# Patient Record
Sex: Female | Born: 1980 | Race: White | Hispanic: No | Marital: Single | Smoking: Never smoker
Health system: Southern US, Community
[De-identification: ages and names within clinical notes are randomized; demographics above are authoritative.]

## PROBLEM LIST (undated history)

## (undated) DIAGNOSIS — F319 Bipolar disorder, unspecified: Secondary | ICD-10-CM

## (undated) DIAGNOSIS — F329 Major depressive disorder, single episode, unspecified: Secondary | ICD-10-CM

## (undated) DIAGNOSIS — F32A Depression, unspecified: Secondary | ICD-10-CM

## (undated) HISTORY — DX: Bipolar disorder, unspecified: F31.9

## (undated) HISTORY — PX: OTHER SURGICAL HISTORY: SHX169

## (undated) HISTORY — DX: Major depressive disorder, single episode, unspecified: F32.9

## (undated) HISTORY — DX: Depression, unspecified: F32.A

---

## 2015-12-08 ENCOUNTER — Ambulatory Visit: Payer: Self-pay | Admitting: Women's Health

## 2015-12-20 ENCOUNTER — Ambulatory Visit (INDEPENDENT_AMBULATORY_CARE_PROVIDER_SITE_OTHER): Payer: 59 | Admitting: Women's Health

## 2015-12-20 ENCOUNTER — Encounter: Payer: Self-pay | Admitting: Women's Health

## 2015-12-20 VITALS — BP 122/80 | Ht 64.0 in | Wt 185.0 lb

## 2015-12-20 DIAGNOSIS — Z833 Family history of diabetes mellitus: Secondary | ICD-10-CM | POA: Diagnosis not present

## 2015-12-20 DIAGNOSIS — Z113 Encounter for screening for infections with a predominantly sexual mode of transmission: Secondary | ICD-10-CM

## 2015-12-20 DIAGNOSIS — Z01419 Encounter for gynecological examination (general) (routine) without abnormal findings: Secondary | ICD-10-CM

## 2015-12-20 LAB — CBC WITH DIFFERENTIAL/PLATELET
BASOS PCT: 0 %
Basophils Absolute: 0 cells/uL (ref 0–200)
EOS PCT: 2 %
Eosinophils Absolute: 124 cells/uL (ref 15–500)
HEMATOCRIT: 40.2 % (ref 35.0–45.0)
HEMOGLOBIN: 13.6 g/dL (ref 11.7–15.5)
LYMPHS ABS: 2480 {cells}/uL (ref 850–3900)
Lymphocytes Relative: 40 %
MCH: 31.1 pg (ref 27.0–33.0)
MCHC: 33.8 g/dL (ref 32.0–36.0)
MCV: 91.8 fL (ref 80.0–100.0)
MONO ABS: 496 {cells}/uL (ref 200–950)
MPV: 10.7 fL (ref 7.5–12.5)
Monocytes Relative: 8 %
NEUTROS ABS: 3100 {cells}/uL (ref 1500–7800)
NEUTROS PCT: 50 %
Platelets: 261 10*3/uL (ref 140–400)
RBC: 4.38 MIL/uL (ref 3.80–5.10)
RDW: 12.4 % (ref 11.0–15.0)
WBC: 6.2 10*3/uL (ref 3.8–10.8)

## 2015-12-20 LAB — HEPATITIS C ANTIBODY: HCV AB: NEGATIVE

## 2015-12-20 LAB — GLUCOSE, RANDOM: GLUCOSE: 92 mg/dL (ref 65–99)

## 2015-12-20 LAB — HEPATITIS B SURFACE ANTIGEN: HEP B S AG: NEGATIVE

## 2015-12-20 LAB — HIV ANTIBODY (ROUTINE TESTING W REFLEX): HIV: NONREACTIVE

## 2015-12-20 NOTE — Addendum Note (Signed)
Addended by: Kem ParkinsonBARNES, Nery Kalisz on: 12/20/2015 12:10 PM   Modules accepted: Orders

## 2015-12-20 NOTE — Patient Instructions (Signed)
Health Maintenance, Female Adopting a healthy lifestyle and getting preventive care can go a long way to promote health and wellness. Talk with your health care provider about what schedule of regular examinations is right for you. This is a good chance for you to check in with your provider about disease prevention and staying healthy. In between checkups, there are plenty of things you can do on your own. Experts have done a lot of research about which lifestyle changes and preventive measures are most likely to keep you healthy. Ask your health care provider for more information. WEIGHT AND DIET  Eat a healthy diet  Be sure to include plenty of vegetables, fruits, low-fat dairy products, and lean protein.  Do not eat a lot of foods high in solid fats, added sugars, or salt.  Get regular exercise. This is one of the most important things you can do for your health.  Most adults should exercise for at least 150 minutes each week. The exercise should increase your heart rate and make you sweat (moderate-intensity exercise).  Most adults should also do strengthening exercises at least twice a week. This is in addition to the moderate-intensity exercise.  Maintain a healthy weight  Body mass index (BMI) is a measurement that can be used to identify possible weight problems. It estimates body fat based on height and weight. Your health care provider can help determine your BMI and help you achieve or maintain a healthy weight.  For females 20 years of age and older:   A BMI below 18.5 is considered underweight.  A BMI of 18.5 to 24.9 is normal.  A BMI of 25 to 29.9 is considered overweight.  A BMI of 30 and above is considered obese.  Watch levels of cholesterol and blood lipids  You should start having your blood tested for lipids and cholesterol at 34 years of age, then have this test every 5 years.  You may need to have your cholesterol levels checked more often if:  Your lipid  or cholesterol levels are high.  You are older than 34 years of age.  You are at high risk for heart disease.  CANCER SCREENING   Lung Cancer  Lung cancer screening is recommended for adults 55-80 years old who are at high risk for lung cancer because of a history of smoking.  A yearly low-dose CT scan of the lungs is recommended for people who:  Currently smoke.  Have quit within the past 15 years.  Have at least a 30-pack-year history of smoking. A pack year is smoking an average of one pack of cigarettes a day for 1 year.  Yearly screening should continue until it has been 15 years since you quit.  Yearly screening should stop if you develop a health problem that would prevent you from having lung cancer treatment.  Breast Cancer  Practice breast self-awareness. This means understanding how your breasts normally appear and feel.  It also means doing regular breast self-exams. Let your health care provider know about any changes, no matter how small.  If you are in your 20s or 30s, you should have a clinical breast exam (CBE) by a health care provider every 1-3 years as part of a regular health exam.  If you are 40 or older, have a CBE every year. Also consider having a breast X-ray (mammogram) every year.  If you have a family history of breast cancer, talk to your health care provider about genetic screening.  If you   are at high risk for breast cancer, talk to your health care provider about having an MRI and a mammogram every year.  Breast cancer gene (BRCA) assessment is recommended for women who have family members with BRCA-related cancers. BRCA-related cancers include:  Breast.  Ovarian.  Tubal.  Peritoneal cancers.  Results of the assessment will determine the need for genetic counseling and BRCA1 and BRCA2 testing. Cervical Cancer Your health care provider may recommend that you be screened regularly for cancer of the pelvic organs (ovaries, uterus, and  vagina). This screening involves a pelvic examination, including checking for microscopic changes to the surface of your cervix (Pap test). You may be encouraged to have this screening done every 3 years, beginning at age 21.  For women ages 30-65, health care providers may recommend pelvic exams and Pap testing every 3 years, or they may recommend the Pap and pelvic exam, combined with testing for human papilloma virus (HPV), every 5 years. Some types of HPV increase your risk of cervical cancer. Testing for HPV may also be done on women of any age with unclear Pap test results.  Other health care providers may not recommend any screening for nonpregnant women who are considered low risk for pelvic cancer and who do not have symptoms. Ask your health care provider if a screening pelvic exam is right for you.  If you have had past treatment for cervical cancer or a condition that could lead to cancer, you need Pap tests and screening for cancer for at least 20 years after your treatment. If Pap tests have been discontinued, your risk factors (such as having a new sexual partner) need to be reassessed to determine if screening should resume. Some women have medical problems that increase the chance of getting cervical cancer. In these cases, your health care provider may recommend more frequent screening and Pap tests. Colorectal Cancer  This type of cancer can be detected and often prevented.  Routine colorectal cancer screening usually begins at 34 years of age and continues through 34 years of age.  Your health care provider may recommend screening at an earlier age if you have risk factors for colon cancer.  Your health care provider may also recommend using home test kits to check for hidden blood in the stool.  A small camera at the end of a tube can be used to examine your colon directly (sigmoidoscopy or colonoscopy). This is done to check for the earliest forms of colorectal  cancer.  Routine screening usually begins at age 50.  Direct examination of the colon should be repeated every 5-10 years through 34 years of age. However, you may need to be screened more often if early forms of precancerous polyps or small growths are found. Skin Cancer  Check your skin from head to toe regularly.  Tell your health care provider about any new moles or changes in moles, especially if there is a change in a mole's shape or color.  Also tell your health care provider if you have a mole that is larger than the size of a pencil eraser.  Always use sunscreen. Apply sunscreen liberally and repeatedly throughout the day.  Protect yourself by wearing long sleeves, pants, a wide-brimmed hat, and sunglasses whenever you are outside. HEART DISEASE, DIABETES, AND HIGH BLOOD PRESSURE   High blood pressure causes heart disease and increases the risk of stroke. High blood pressure is more likely to develop in:  People who have blood pressure in the high end   of the normal range (130-139/85-89 mm Hg).  People who are overweight or obese.  People who are African American.  If you are 38-23 years of age, have your blood pressure checked every 3-5 years. If you are 61 years of age or older, have your blood pressure checked every year. You should have your blood pressure measured twice--once when you are at a hospital or clinic, and once when you are not at a hospital or clinic. Record the average of the two measurements. To check your blood pressure when you are not at a hospital or clinic, you can use:  An automated blood pressure machine at a pharmacy.  A home blood pressure monitor.  If you are between 45 years and 39 years old, ask your health care provider if you should take aspirin to prevent strokes.  Have regular diabetes screenings. This involves taking a blood sample to check your fasting blood sugar level.  If you are at a normal weight and have a low risk for diabetes,  have this test once every three years after 34 years of age.  If you are overweight and have a high risk for diabetes, consider being tested at a younger age or more often. PREVENTING INFECTION  Hepatitis B  If you have a higher risk for hepatitis B, you should be screened for this virus. You are considered at high risk for hepatitis B if:  You were born in a country where hepatitis B is common. Ask your health care provider which countries are considered high risk.  Your parents were born in a high-risk country, and you have not been immunized against hepatitis B (hepatitis B vaccine).  You have HIV or AIDS.  You use needles to inject street drugs.  You live with someone who has hepatitis B.  You have had sex with someone who has hepatitis B.  You get hemodialysis treatment.  You take certain medicines for conditions, including cancer, organ transplantation, and autoimmune conditions. Hepatitis C  Blood testing is recommended for:  Everyone born from [birth year] through 1964.  Anyone with known risk factors for hepatitis C. Sexually transmitted infections (STIs)  You should be screened for sexually transmitted infections (STIs) including gonorrhea and chlamydia if:  You are sexually active and are younger than 34 years of age.  You are older than 34 years of age and your health care provider tells you that you are at risk for this type of infection.  Your sexual activity has changed since you were last screened and you are at an increased risk for chlamydia or gonorrhea. Ask your health care provider if you are at risk.  If you do not have HIV, but are at risk, it may be recommended that you take a prescription medicine daily to prevent HIV infection. This is called pre-exposure prophylaxis (PrEP). You are considered at risk if:  You are sexually active and do not regularly use condoms or know the HIV status of your partner(s).  You take drugs by injection.  You are sexually  active with a partner who has HIV. Talk with your health care provider about whether you are at high risk of being infected with HIV. If you choose to begin PrEP, you should first be tested for HIV. You should then be tested every 3 months for as long as you are taking PrEP.  PREGNANCY   If you are premenopausal and you may become pregnant, ask your health care provider about preconception counseling.  If you may  become pregnant, take 400 to 800 micrograms (mcg) of folic acid every day.  If you want to prevent pregnancy, talk to your health care provider about birth control (contraception). OSTEOPOROSIS AND MENOPAUSE   Osteoporosis is a disease in which the bones lose minerals and strength with aging. This can result in serious bone fractures. Your risk for osteoporosis can be identified using a bone density scan.  If you are 65 years of age or older, or if you are at risk for osteoporosis and fractures, ask your health care provider if you should be screened.  Ask your health care provider whether you should take a calcium or vitamin D supplement to lower your risk for osteoporosis.  Menopause may have certain physical symptoms and risks.  Hormone replacement therapy may reduce some of these symptoms and risks. Talk to your health care provider about whether hormone replacement therapy is right for you.  HOME CARE INSTRUCTIONS   Schedule regular health, dental, and eye exams.  Stay current with your immunizations.   Do not use any tobacco products including cigarettes, chewing tobacco, or electronic cigarettes.  If you are pregnant, do not drink alcohol.  If you are breastfeeding, limit how much and how often you drink alcohol.  Limit alcohol intake to no more than 1 drink per day for nonpregnant women. One drink equals 12 ounces of beer, 5 ounces of wine, or 1 ounces of hard liquor.  Do not use street drugs.  Do not share needles.  Ask your health care provider for help if  you need support or information about quitting drugs.  Tell your health care provider if you often feel depressed.  Tell your health care provider if you have ever been abused or do not feel safe at home.   This information is not intended to replace advice given to you by your health care provider. Make sure you discuss any questions you have with your health care provider.   Document Released: 03/12/2011 Document Revised: 09/17/2014 Document Reviewed: 07/29/2013 Elsevier Interactive Patient Education 2016 Else[employer] Inc. Levonorgestrel intrauterine device (IUD) What is this medicine? LEVONORGESTREL IUD (LEE voe nor jes trel) is a contraceptive (birth control) device. The device is placed inside the uterus by a healthcare professional. It is used to prevent pregnancy and can also be used to treat heavy bleeding that occurs during your period. Depending on the device, it can be used for 3 to 5 years. This medicine may be used for other purposes; ask your health care provider or pharmacist if you have questions. What should I tell my health care provider before I take this medicine? They need to know if you have any of these conditions: -abnormal Pap smear -cancer of the breast, uterus, or cervix -diabetes -endometritis -genital or pelvic infection now or in the past -have more than one sexual partner or your partner has more than one partner -heart disease -history of an ectopic or tubal pregnancy -immune system problems -IUD in place -liver disease or tumor -problems with blood clots or take blood-thinners -use intravenous drugs -uterus of unusual shape -vaginal bleeding that has not been explained -an unusual or allergic reaction to levonorgestrel, other hormones, silicone, or polyethylene, medicines, foods, dyes, or preservatives -pregnant or trying to get pregnant -breast-feeding How should I use this medicine? This device is placed inside the uterus by a health care  professional. Talk to your pediatrician regarding the use of this medicine in children. Special care may be needed. Overdosage: If you think   you have taken too much of this medicine contact a poison control center or emergency room at once. NOTE: This medicine is only for you. Do not share this medicine with others. What if I miss a dose? This does not apply. What may interact with this medicine? Do not take this medicine with any of the following medications: -amprenavir -bosentan -fosamprenavir This medicine may also interact with the following medications: -aprepitant -barbiturate medicines for inducing sleep or treating seizures -bexarotene -griseofulvin -medicines to treat seizures like carbamazepine, ethotoin, felbamate, oxcarbazepine, phenytoin, topiramate -modafinil -pioglitazone -rifabutin -rifampin -rifapentine -some medicines to treat HIV infection like atazanavir, indinavir, lopinavir, nelfinavir, tipranavir, ritonavir -St. John's wort -warfarin This list may not describe all possible interactions. Give your health care provider a list of all the medicines, herbs, non-prescription drugs, or dietary supplements you use. Also tell them if you smoke, drink alcohol, or use illegal drugs. Some items may interact with your medicine. What should I watch for while using this medicine? Visit your doctor or health care professional for regular check ups. See your doctor if you or your partner has sexual contact with others, becomes HIV positive, or gets a sexual transmitted disease. This product does not protect you against HIV infection (AIDS) or other sexually transmitted diseases. You can check the placement of the IUD yourself by reaching up to the top of your vagina with clean fingers to feel the threads. Do not pull on the threads. It is a good habit to check placement after each menstrual period. Call your doctor right away if you feel more of the IUD than just the threads or if  you cannot feel the threads at all. The IUD may come out by itself. You may become pregnant if the device comes out. If you notice that the IUD has come out use a backup birth control method like condoms and call your health care provider. Using tampons will not change the position of the IUD and are okay to use during your period. What side effects may I notice from receiving this medicine? Side effects that you should report to your doctor or health care professional as soon as possible: -allergic reactions like skin rash, itching or hives, swelling of the face, lips, or tongue -fever, flu-like symptoms -genital sores -high blood pressure -no menstrual period for 6 weeks during use -pain, swelling, warmth in the leg -pelvic pain or tenderness -severe or sudden headache -signs of pregnancy -stomach cramping -sudden shortness of breath -trouble with balance, talking, or walking -unusual vaginal bleeding, discharge -yellowing of the eyes or skin Side effects that usually do not require medical attention (report to your doctor or health care professional if they continue or are bothersome): -acne -breast pain -change in sex drive or performance -changes in weight -cramping, dizziness, or faintness while the device is being inserted -headache -irregular menstrual bleeding within first 3 to 6 months of use -nausea This list may not describe all possible side effects. Call your doctor for medical advice about side effects. You may report side effects to FDA at 1-800-FDA-1088. Where should I keep my medicine? This does not apply. NOTE: This sheet is a summary. It may not cover all possible information. If you have questions about this medicine, talk to your doctor, pharmacist, or health care provider.    2016, Elsevier/Gold Standard. (2011-09-27 13:54:04)

## 2015-12-20 NOTE — Progress Notes (Signed)
[place] February 20, 1981 [id number]    History:    Presents for annual exam.  [place] [facility]. Same partner for 3 years requests STD screen, negative STD screen in the past with partner. History of anxiety and depression has been seen by psychiatrist and was on medication but is no longer on medication. Reports normal Pap history.  Past medical history, past surgical history, family history and social history were all reviewed and documented in the EPIC chart. Works in Clinical biochemistcustomer service. Parents heart disease.  ROS:  A ROS was performed and pertinent positives and negatives are included.  Exam:  Filed Vitals:   12/20/15 1109  BP: 122/80    General appearance:  Normal Thyroid:  Symmetrical, normal in size, without palpable masses or nodularity. Respiratory  Auscultation:  Clear without wheezing or rhonchi Cardiovascular  Auscultation:  Regular rate, without rubs, murmurs or gallops  Edema/varicosities:  Not grossly evident Abdominal  Soft,nontender, without masses, guarding or rebound.  Liver/spleen:  No organomegaly noted  Hernia:  None appreciated  Skin  Inspection:  Grossly normal   Breasts: Examined lying and sitting.     Right: Without masses, retractions, discharge or axillary adenopathy.     Left: Without masses, retractions, discharge or axillary adenopathy. Gentitourinary   Inguinal/mons:  Normal without inguinal adenopathy  External genitalia:  Normal  BUS/Urethra/Skene's glands:  Normal  Vagina:  Normal  Cervix:  Normal IUD strings visible  Uterus:  normal in size, shape and contour.  Midline and mobile  Adnexa/parametria:     Rt: Without masses or tenderness.   Lt: Without masses or tenderness.  Anus and perineum: Normal  Digital rectal exam: Normal sphincter tone without palpated masses or tenderness  Assessment/Plan:  35 y.o. S WF G1 P0  for annual exam with no complaints.  04/2011 Mirena  IUD-rare bleeding STD screen Obesity History of bipolar disease-psychiatrist in the past  Plan: Contraception options reviewed would like to have Mirena IUD removed and replaced in August. Reviewed slight risk for infection, perforation or hemorrhage. Will check coverage. Strongly encouraged to schedule appointment with psychiatrist for medication and counseling. SBE's, continue regular exercise, calcium rich diet, MVI daily decrease calories encouraged. CBC, glucose, UA, Pap with HR HPV typing new screening guidelines reviewed, GC/Chlamydia, HIV, hep B, C, RPRHarrington Challenger.  Wassim Kirksey J WHNP, 11:46 AM 12/20/2015

## 2015-12-21 LAB — URINALYSIS W MICROSCOPIC + REFLEX CULTURE
BILIRUBIN URINE: NEGATIVE
Bacteria, UA: NONE SEEN [HPF]
CRYSTALS: NONE SEEN [HPF]
Casts: NONE SEEN [LPF]
GLUCOSE, UA: NEGATIVE
Hgb urine dipstick: NEGATIVE
Ketones, ur: NEGATIVE
LEUKOCYTES UA: NEGATIVE
NITRITE: NEGATIVE
Protein, ur: NEGATIVE
RBC / HPF: NONE SEEN RBC/HPF (ref <=2)
SPECIFIC GRAVITY, URINE: 1.023 (ref 1.001–1.035)
WBC UA: NONE SEEN WBC/HPF (ref <=5)
Yeast: NONE SEEN [HPF]
pH: 7 (ref 5.0–8.0)

## 2015-12-21 LAB — GC/CHLAMYDIA PROBE AMP
CT PROBE, AMP APTIMA: NOT DETECTED
GC Probe RNA: NOT DETECTED

## 2015-12-21 LAB — RPR

## 2015-12-22 LAB — PAP, TP IMAGING W/ HPV RNA, RFLX HPV TYPE 16,18/45: HPV mRNA, High Risk: NOT DETECTED

## 2015-12-27 ENCOUNTER — Telehealth: Payer: Self-pay | Admitting: Gynecology

## 2015-12-27 NOTE — Telephone Encounter (Signed)
12/27/15-I LM VM for pt that her [employer] will cover the removal of existing IUD and insertion of New Mirena for contraception at 100%, no copay.  She already has appt with TF set up for August. This is a calendar year plan. Per LEE & [employer]-Ref#[id number]

## 2016-03-23 ENCOUNTER — Ambulatory Visit (INDEPENDENT_AMBULATORY_CARE_PROVIDER_SITE_OTHER): Payer: 59 | Admitting: Gynecology

## 2016-03-23 ENCOUNTER — Encounter: Payer: Self-pay | Admitting: Gynecology

## 2016-03-23 VITALS — BP 116/74

## 2016-03-23 DIAGNOSIS — Z30433 Encounter for removal and reinsertion of intrauterine contraceptive device: Secondary | ICD-10-CM | POA: Diagnosis not present

## 2016-03-23 NOTE — Patient Instructions (Signed)
Intrauterine Device Insertion Most often, an intrauterine device (IUD) is inserted into the uterus to prevent pregnancy. There are 2 types of IUDs available:  Copper IUD--This type of IUD creates an environment that is not favorable to sperm survival. The mechanism of action of the copper IUD is not known for certain. It can stay in place for 10 years.  Hormone IUD--This type of IUD contains the hormone progestin (synthetic progesterone). The progestin thickens the cervical mucus and prevents sperm from entering the uterus, and it also thins the uterine lining. There is no evidence that the hormone IUD prevents implantation. One hormone IUD can stay in place for up to 5 years, and a different hormone IUD can stay in place for up to 3 years. An IUD is the most cost-effective birth control if left in place for the full duration. It may be removed at any time. LET YOUR HEALTH CARE PROVIDER KNOW ABOUT:  Any allergies you have.  All medicines you are taking, including vitamins, herbs, eye drops, creams, and over-the-counter medicines.  Previous problems you or members of your family have had with the use of anesthetics.  Any blood disorders you have.  Previous surgeries you have had.  Possibility of pregnancy.  Medical conditions you have. RISKS AND COMPLICATIONS  Generally, intrauterine device insertion is a safe procedure. However, as with any procedure, complications can occur. Possible complications include:  Accidental puncture (perforation) of the uterus.  Accidental placement of the IUD either in the muscle layer of the uterus (myometrium) or outside the uterus. If this happens, the IUD can be found essentially floating around the bowels and must be taken out surgically.  The IUD may fall out of the uterus (expulsion). This is more common in women who have recently had a child.   Pregnancy in the fallopian tube (ectopic).  Pelvic inflammatory disease (PID), which is infection of  the uterus and fallopian tubes. The risk of PID is slightly increased in the first 20 days after the IUD is placed, but the overall risk is still very low. BEFORE THE PROCEDURE  Schedule the IUD insertion for when you will have your menstrual period or right after, to make sure you are not pregnant. Placement of the IUD is better tolerated shortly after a menstrual cycle.  You may need to take tests or be examined to make sure you are not pregnant.  You may be required to take a pregnancy test.  You may be required to get checked for sexually transmitted infections (STIs) prior to placement. Placing an IUD in someone who has an infection can make the infection worse.  You may be given a pain reliever to take 1 or 2 hours before the procedure.  An exam will be performed to determine the size and position of your uterus.  Ask your health care provider about changing or stopping your regular medicines. PROCEDURE   A tool (speculum) is placed in the vagina. This allows your health care provider to see the lower part of the uterus (cervix).  The cervix is prepped with a medicine that lowers the risk of infection.  You may be given a medicine to numb each side of the cervix (intracervical or paracervical block). This is used to block and control any discomfort with insertion.  A tool (uterine sound) is inserted into the uterus to determine the length of the uterine cavity and the direction the uterus may be tilted.  A slim instrument (IUD inserter) is inserted through the cervical   canal and into your uterus.  The IUD is placed in the uterine cavity and the insertion device is removed.  The nylon string that is attached to the IUD and used for eventual IUD removal is trimmed. It is trimmed so that it lays high in the vagina, just outside the cervix. AFTER THE PROCEDURE  You may have bleeding after the procedure. This is normal. It varies from light spotting for a few days to menstrual-like  bleeding.  You may have mild cramping.   This information is not intended to replace advice given to you by your health care provider. Make sure you discuss any questions you have with your health care provider.   Document Released: 04/25/2011 Document Revised: 06/17/2013 Document Reviewed: 02/15/2013 Elsevier Interactive Patient Education 2016 [employer]  

## 2016-03-23 NOTE — Progress Notes (Signed)
    Annamaria BootsMary Mcconaughey [birth year] [id number]        35 y.o.  G1P0010  presents for Mirena IUD replacement. She is at the five-year mark with her current Mirena. She has read through the booklet, has no contraindications and signed the consent form. She is not having regular menses but occasional spotting.  I reviewed the insertional process with her as well as the risks to include infection, either immediate or long-term, uterine perforation or migration requiring surgery to remove, other complications such as pain, hormonal side effects, infertility and possibility of failure with subsequent pregnancy.   Exam with Kennon PortelaKim Gardner assistant Filed Vitals:   03/23/16 1457  BP: 116/74    Pelvic: External BUS vagina normal. Cervix normal with IUD string visualized. Uterus anteverted normal size shape contour midline mobile nontender. Adnexa without masses or tenderness.  Procedure: The cervix was visualized with a speculum, the IUD string was grasped with the [facility]p and the old Mirena IUD was removed, shown to the patient and discarded. The cervix was then cleansed with Betadine, anterior lip grasped with a single-tooth tenaculum, the uterus was sounded and a Mirena IUD was placed according to manufacturer's recommendations without difficulty. The strings were trimmed. The patient tolerated well and will follow up in one month for a postinsertional check.  Lot number:  [id number]    Helayne Metsker P MD, 3:10 PM 03/23/2016

## 2016-04-20 ENCOUNTER — Ambulatory Visit: Payer: 59 | Admitting: Gynecology

## 2016-04-24 ENCOUNTER — Ambulatory Visit (INDEPENDENT_AMBULATORY_CARE_PROVIDER_SITE_OTHER): Payer: 59 | Admitting: Gynecology

## 2016-04-24 ENCOUNTER — Encounter: Payer: Self-pay | Admitting: Gynecology

## 2016-04-24 VITALS — BP 118/76

## 2016-04-24 DIAGNOSIS — Z30431 Encounter for routine checking of intrauterine contraceptive device: Secondary | ICD-10-CM

## 2016-04-24 NOTE — Patient Instructions (Addendum)
Follow up  for your annual exam April 2018

## 2016-04-24 NOTE — Progress Notes (Signed)
    Kelly BootsMary Park 08/31/1981 [id number]        35 y.o.  G1P0010  Presents for follow up IUD check. Had Mirena IUD placed 03/23/2016. Is without complaints.  Past medical history,surgical history, problem list, medications, allergies, family history and social history were all reviewed and documented in the EPIC chart.  Directed ROS with pertinent positives and negatives documented in the history of present illness/assessment and plan.  Exam : Kennon PortelaKim Gardner assistant Vitals:   04/24/16 1420  BP: 118/76   General appearance:  Normal Abdomen soft nontender without masses guarding rebound Pelvic external BUS vagina normal. Cervix normal. IUD string visualized and appropriate length. uterus normal size midline mobile nontender. Adnexa without masses or tenderness.  Assessment/Plan:  35 y.o. G1P0010  with normal follow up IUD check. Assuming she does well then she'll follow up in April 2018, sooner if any issues.    Dara LordsFONTAINE,Kelly Beaumont P MD, 2:39 PM 04/24/2016

## 2016-05-01 ENCOUNTER — Ambulatory Visit: Payer: 59 | Admitting: Gynecology

## 2017-11-03 ENCOUNTER — Emergency Department (HOSPITAL_COMMUNITY)
Admission: EM | Admit: 2017-11-03 | Discharge: 2017-11-03 | Disposition: A | Discharge status: Home / Self Care | Payer: 59 | Attending: Emergency Medicine | Admitting: Emergency Medicine

## 2017-11-03 ENCOUNTER — Emergency Department (HOSPITAL_COMMUNITY): Payer: 59

## 2017-11-03 ENCOUNTER — Encounter (HOSPITAL_COMMUNITY): Payer: Self-pay | Admitting: Emergency Medicine

## 2017-11-03 DIAGNOSIS — Z23 Encounter for immunization: Secondary | ICD-10-CM | POA: Insufficient documentation

## 2017-11-03 DIAGNOSIS — Y999 Unspecified external cause status: Secondary | ICD-10-CM | POA: Diagnosis not present

## 2017-11-03 DIAGNOSIS — R0602 Shortness of breath: Secondary | ICD-10-CM | POA: Insufficient documentation

## 2017-11-03 DIAGNOSIS — R079 Chest pain, unspecified: Secondary | ICD-10-CM | POA: Insufficient documentation

## 2017-11-03 DIAGNOSIS — Y939 Activity, unspecified: Secondary | ICD-10-CM | POA: Diagnosis not present

## 2017-11-03 DIAGNOSIS — S21111A Laceration without foreign body of right front wall of thorax without penetration into thoracic cavity, initial encounter: Secondary | ICD-10-CM | POA: Insufficient documentation

## 2017-11-03 DIAGNOSIS — R1011 Right upper quadrant pain: Secondary | ICD-10-CM | POA: Insufficient documentation

## 2017-11-03 DIAGNOSIS — S299XXA Unspecified injury of thorax, initial encounter: Secondary | ICD-10-CM | POA: Diagnosis present

## 2017-11-03 DIAGNOSIS — Y92009 Unspecified place in unspecified non-institutional (private) residence as the place of occurrence of the external cause: Secondary | ICD-10-CM | POA: Diagnosis not present

## 2017-11-03 DIAGNOSIS — T148XXA Other injury of unspecified body region, initial encounter: Secondary | ICD-10-CM

## 2017-11-03 LAB — I-STAT CHEM 8, ED
BUN: 13 mg/dL (ref 6–20)
CALCIUM ION: 1.11 mmol/L — AB (ref 1.15–1.40)
CHLORIDE: 103 mmol/L (ref 101–111)
Creatinine, Ser: 0.7 mg/dL (ref 0.44–1.00)
GLUCOSE: 132 mg/dL — AB (ref 65–99)
HCT: 45 % (ref 36.0–46.0)
Hemoglobin: 15.3 g/dL — ABNORMAL HIGH (ref 12.0–15.0)
Potassium: 3.6 mmol/L (ref 3.5–5.1)
Sodium: 141 mmol/L (ref 135–145)
TCO2: 24 mmol/L (ref 22–32)

## 2017-11-03 LAB — CBC
HCT: 44.8 % (ref 36.0–46.0)
HEMOGLOBIN: 15.2 g/dL — AB (ref 12.0–15.0)
MCH: 30.8 pg (ref 26.0–34.0)
MCHC: 33.9 g/dL (ref 30.0–36.0)
MCV: 90.9 fL (ref 78.0–100.0)
Platelets: 307 10*3/uL (ref 150–400)
RBC: 4.93 MIL/uL (ref 3.87–5.11)
RDW: 12.1 % (ref 11.5–15.5)
WBC: 13.1 10*3/uL — ABNORMAL HIGH (ref 4.0–10.5)

## 2017-11-03 LAB — COMPREHENSIVE METABOLIC PANEL
ALK PHOS: 73 U/L (ref 38–126)
ALT: 20 U/L (ref 14–54)
ANION GAP: 10 (ref 5–15)
AST: 33 U/L (ref 15–41)
Albumin: 4 g/dL (ref 3.5–5.0)
BUN: 12 mg/dL (ref 6–20)
CALCIUM: 8.8 mg/dL — AB (ref 8.9–10.3)
CO2: 22 mmol/L (ref 22–32)
Chloride: 105 mmol/L (ref 101–111)
Creatinine, Ser: 0.78 mg/dL (ref 0.44–1.00)
GFR calc non Af Amer: >60 mL/min (ref >60)
Glucose, Bld: 131 mg/dL — ABNORMAL HIGH (ref 65–99)
Potassium: 3.6 mmol/L (ref 3.5–5.1)
SODIUM: 137 mmol/L (ref 135–145)
TOTAL PROTEIN: 7.4 g/dL (ref 6.5–8.1)
Total Bilirubin: 0.8 mg/dL (ref 0.3–1.2)

## 2017-11-03 LAB — ABO/RH: ABO/RH(D): O POS

## 2017-11-03 LAB — BPAM FFP
Blood Product Expiration Date: 201903012359
Blood Product Expiration Date: 201903082359
ISSUE DATE / TIME: 201902241702
ISSUE DATE / TIME: 201902241702
Unit Type and Rh: 6200
Unit Type and Rh: 6200

## 2017-11-03 LAB — PREPARE FRESH FROZEN PLASMA
UNIT DIVISION: 0
Unit division: 0

## 2017-11-03 LAB — PROTIME-INR
INR: 0.99
Prothrombin Time: 13 seconds (ref 11.4–15.2)

## 2017-11-03 LAB — I-STAT BETA HCG BLOOD, ED (MC, WL, AP ONLY): I-stat hCG, quantitative: <5 m[IU]/mL (ref <5)

## 2017-11-03 LAB — I-STAT CG4 LACTIC ACID, ED: Lactic Acid, Venous: 1.73 mmol/L (ref 0.5–1.9)

## 2017-11-03 LAB — BLOOD PRODUCT ORDER (VERBAL) VERIFICATION

## 2017-11-03 LAB — ETHANOL

## 2017-11-03 MED ORDER — IOPAMIDOL (ISOVUE-300) INJECTION 61%
INTRAVENOUS | Status: AC
  Administered 2017-11-03: 100 mL via INTRAVENOUS
  Filled 2017-11-03: qty 100

## 2017-11-03 MED ORDER — FENTANYL CITRATE (PF) 100 MCG/2ML IJ SOLN
INTRAMUSCULAR | Status: AC | PRN
  Administered 2017-11-03: 50 ug via INTRAVENOUS

## 2017-11-03 MED ORDER — TETANUS-DIPHTH-ACELL PERTUSSIS 5-2.5-18.5 LF-MCG/0.5 IM SUSP
0.5000 mL | Freq: Once | INTRAMUSCULAR | Status: AC
  Administered 2017-11-03: 0.5 mL via INTRAMUSCULAR
  Filled 2017-11-03: qty 0.5
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
## 2017-11-03 NOTE — Discharge Instructions (Signed)
Keep the area clean. You can apply vasoline or bacitracin to the area to keep in moist.  This could get infected so return for fever, redness, drainage.
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
## 2017-11-04 ENCOUNTER — Encounter: Payer: Self-pay | Admitting: Gynecology

## 2017-11-04 LAB — TYPE AND SCREEN
ABO/RH(D): O POS
ANTIBODY SCREEN: NEGATIVE
UNIT DIVISION: 0
UNIT DIVISION: 0

## 2017-11-04 LAB — BPAM RBC
Blood Product Expiration Date: 201903112359
Blood Product Expiration Date: 201903112359
ISSUE DATE / TIME: 201902241700
ISSUE DATE / TIME: 201902241700
UNIT TYPE AND RH: 9500
UNIT TYPE AND RH: 9500

## 2017-11-05 LAB — CDS SEROLOGY

## 2019-06-02 ENCOUNTER — Encounter: Payer: Self-pay | Admitting: Gynecology
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
## 2020-12-09 DEATH — deceased
